# Patient Record
Sex: Female | Born: 1975 | Race: White | Hispanic: No | Marital: Married | Smoking: Current every day smoker
Health system: Southern US, Community
[De-identification: ages and names within clinical notes are randomized; demographics above are authoritative.]

## PROBLEM LIST (undated history)

## (undated) DIAGNOSIS — G43909 Migraine, unspecified, not intractable, without status migrainosus: Secondary | ICD-10-CM

## (undated) DIAGNOSIS — G2581 Restless legs syndrome: Secondary | ICD-10-CM

## (undated) HISTORY — DX: Restless legs syndrome: G25.81

## (undated) HISTORY — DX: Migraine, unspecified, not intractable, without status migrainosus: G43.909

---

## 2011-02-08 ENCOUNTER — Other Ambulatory Visit: Payer: Self-pay

## 2011-02-13 ENCOUNTER — Other Ambulatory Visit (HOSPITAL_COMMUNITY): Payer: Self-pay | Admitting: *Deleted

## 2011-02-13 DIAGNOSIS — O280 Abnormal hematological finding on antenatal screening of mother: Secondary | ICD-10-CM

## 2011-02-19 ENCOUNTER — Ambulatory Visit (HOSPITAL_COMMUNITY)
Admission: RE | Admit: 2011-02-19 | Discharge: 2011-02-19 | Disposition: A | Discharge status: Home / Self Care | Payer: BC Managed Care – PPO | Source: Ambulatory Visit | Attending: Obstetrics and Gynecology | Admitting: Obstetrics and Gynecology

## 2011-02-19 ENCOUNTER — Other Ambulatory Visit (HOSPITAL_COMMUNITY): Payer: Self-pay | Admitting: *Deleted

## 2011-02-19 ENCOUNTER — Encounter (HOSPITAL_COMMUNITY): Payer: Self-pay

## 2011-02-19 DIAGNOSIS — O99330 Smoking (tobacco) complicating pregnancy, unspecified trimester: Secondary | ICD-10-CM | POA: Insufficient documentation

## 2011-02-19 DIAGNOSIS — O280 Abnormal hematological finding on antenatal screening of mother: Secondary | ICD-10-CM

## 2011-02-19 DIAGNOSIS — O3440 Maternal care for other abnormalities of cervix, unspecified trimester: Secondary | ICD-10-CM | POA: Insufficient documentation

## 2011-02-19 DIAGNOSIS — Z8751 Personal history of pre-term labor: Secondary | ICD-10-CM | POA: Insufficient documentation

## 2011-02-19 DIAGNOSIS — Z363 Encounter for antenatal screening for malformations: Secondary | ICD-10-CM | POA: Insufficient documentation

## 2011-02-19 DIAGNOSIS — Z1389 Encounter for screening for other disorder: Secondary | ICD-10-CM | POA: Insufficient documentation

## 2011-02-19 DIAGNOSIS — O358XX0 Maternal care for other (suspected) fetal abnormality and damage, not applicable or unspecified: Secondary | ICD-10-CM | POA: Insufficient documentation

## 2011-02-19 DIAGNOSIS — O09529 Supervision of elderly multigravida, unspecified trimester: Secondary | ICD-10-CM | POA: Insufficient documentation

## 2011-02-19 NOTE — Progress Notes (Signed)
OB ultrasound completed today.  Please see report in [facility].

## 2011-02-19 NOTE — Progress Notes (Signed)
Genetic Counseling  High-Risk Gestation Note  Appointment Date:  02/19/2011 Referred By: Tasha Ponto, DO Date of Birth:  [birth year] Partner:  Tasha Campbell Attending: Rica Koyanagi, MD   [name] and her partner, Mr. Tasha Campbell, were seen for genetic counseling regarding a maternal age of 35 y.o. and an increased risk for Down syndrome based on Quad screening performed through [facility]. The couple was accompanied by [name] today.   They were counseled regarding maternal age and the association with risk for chromosome conditions due to nondisjunction with aging of the ova.   We reviewed chromosomes, nondisjunction, and the associated 1 in 111 risk for fetal aneuploidy related to a maternal age of 21 at [redacted] weeks gestation.  They were counseled that the risk for aneuploidy decreases as gestational age increases, accounting for those pregnancies which spontaneously abort.  We specifically discussed Down syndrome (trisomy 2), trisomies 66 and 40, and sex chromosome aneuploidies (47,XXX and 47,XXY) including the common features and prognoses of each.   We also reviewed Tasha Campbell's maternal serum Quad screen result and the associated increase in risk for fetal Down syndrome (1 in 207 to 1 in 53).  They were counseled regarding other explanations for a screen positive result including normal variation and differences in maternal metabolism.  In addition, we reviewed the screen negative risks for trisomy 18 and ONTDs.  They understand that Quad screening provides a pregnancy specific risk for Down syndrome, but is not considered to be diagnostic.  Additionally, we discussed that the level of one of the proteins analyzed (DIA) is very high (3.09 MoM). This has been associated with an increased risk for adverse pregnancy outcomes. Thus, a third trimester ultrasound may be offered to assess for fetal growth.   They were counseled regarding other available screening and diagnostic  options including ultrasound and amniocentesis.  The risks, benefits, and limitations of each of these options were reviewed in detail.  After thoughtful consideration of these options, they elected to proceed with ultrasound, but declined amniocentesis.  A complete detailed ultrasound was performed today.  The ultrasound report will be sent under separate cover.  They understand that screening tests cannot rule out all birth defects or genetic syndromes.  The patient was advised of this limitation and states she still does not want diagnostic testing at this time.  However, they were counseled that 50-80% of fetuses with Down syndrome and up to 90% of fetuses with trisomies 13 and 18, when well visualized, have detectable anomalies or soft markers by ultrasound.   Tasha Campbell was provided with written information regarding cystic fibrosis (CF) including the carrier frequency and incidence in the Caucasian population, the availability of carrier testing and prenatal diagnosis if indicated.  In addition, we discussed that CF is routinely screened for as part of the [place] newborn screening panel.  She declined testing today.   Both family histories were reviewed and found to be contributory for the father of the pregnancy's maternal first cousin once removed with cleft lip. We discussed that cleft lip +/- cleft palate can be syndromic or isolated.  If the patient's relative has a syndromic form of clefting, the chance of having an affected child depends on the inheritance pattern of that condition.  If the patient's relative has an isolated form of clefting, we discussed the probable multifactorial inheritance and explained that genetic testing for isolated cleft lip +/- cleft palate is not currently available.  Based on the family history, this couple's  chance to have a baby with an isolated cleft lip +/- cleft palate is not increased above the general population risk, in the case of isolated occurrence and assuming  multifactorial inheritance.  The father of the pregnancy also reported a female maternal first cousin with cerebral palsy and mild mental retardation due to oxygen deprivation after birth. This family history is not thought to increase the chance for cerebral palsy in the current pregnancy, assuming that the diagnosis is correct and especially when a cause is highly suspected. Additionally, Mr. Tasha Campbell reported a female maternal first cousin once removed with autism.  We discussed that autism spectrum disorders are typically multifactorial in etiology, caused by a combination of both genes and environmental factors. However, autism can occur as a feature of an underlying genetic condition such as fragile X syndrome.  If the relative has idiopathic autism, the risk of recurrence is estimated to be close to that of the general population.  If this relative's autism is syndromic, the risk of recurrence depends upon the inheritance of the condition. Without further information regarding the provided family history, an accurate genetic risk cannot be calculated.   Further genetic counseling is warranted if more information is obtained.  Tasha Campbell reported a female maternal first cousin with hydrocephalus that required shunt placement. She is currently 36 years old and reportedly healthy. Her two children are also reportedly in good health. Hydrocephalus may be an isolated finding that occurs sporadically, it may be an isolated condition that is inherited or passed through families, or it may be one feature of an underlying genetic syndrome or chromosome abnormality.  If it were a sporadic isolated event, the risk to the current pregnancy is not expected to be increased over the general population risk given the degree of relationship.  Additional information is needed in order to assess the risk to the current pregnancy. Without further information regarding the provided family history, an accurate genetic risk cannot  be calculated. Further genetic counseling is warranted if more information is obtained.  Ms. Ettamae Barkett denied exposure to environmental toxins or chemical agents. She denied the use of alcohol or street drugs. She reported smoking approximately a pack of cigarettes per day. The associations of smoking in pregnancy were reviewed and cessation encouraged. She denied significant viral illnesses during the course of her pregnancy. Her medical and surgical histories were noncontributory.     I counseled this couple regarding the above risks and available options.  The approximate face-to-face time with the genetic counselor was 40 minutes.    Clydie Braun Eiman Maret, MS, [facility] 02/19/2011

## 2011-04-04 ENCOUNTER — Ambulatory Visit (HOSPITAL_COMMUNITY)
Admission: RE | Admit: 2011-04-04 | Payer: BC Managed Care – PPO | Source: Ambulatory Visit | Attending: Maternal and Fetal Medicine | Admitting: Maternal and Fetal Medicine

## 2017-09-12 DIAGNOSIS — F17218 Nicotine dependence, cigarettes, with other nicotine-induced disorders: Secondary | ICD-10-CM | POA: Diagnosis not present

## 2017-09-12 DIAGNOSIS — D72829 Elevated white blood cell count, unspecified: Secondary | ICD-10-CM | POA: Diagnosis not present

## 2017-10-21 ENCOUNTER — Other Ambulatory Visit: Payer: Self-pay | Admitting: Internal Medicine

## 2017-10-21 DIAGNOSIS — N6452 Nipple discharge: Secondary | ICD-10-CM

## 2017-10-24 ENCOUNTER — Ambulatory Visit
Admission: RE | Admit: 2017-10-24 | Discharge: 2017-10-24 | Disposition: A | Discharge status: Home / Self Care | Payer: BLUE CROSS/BLUE SHIELD | Source: Ambulatory Visit | Attending: Internal Medicine | Admitting: Internal Medicine

## 2017-10-24 DIAGNOSIS — N6452 Nipple discharge: Secondary | ICD-10-CM

## 2017-12-01 ENCOUNTER — Encounter: Payer: Self-pay | Admitting: Gastroenterology

## 2017-12-19 ENCOUNTER — Encounter: Payer: Self-pay | Admitting: Hematology and Oncology

## 2017-12-19 ENCOUNTER — Telehealth: Payer: Self-pay | Admitting: Hematology and Oncology

## 2017-12-19 NOTE — Telephone Encounter (Signed)
New referral from Dr. Derrell LollingIngram at [facility] for the high risk breast clinic. Pt has been scheduled to see Dr. Pamelia HoitGudena on 10/2 at 1pm. Pt aware to arrive 30 minutes early. Cld Kim at [facility] to obtain the pt's path rpt. Letter mailed to the pt.

## 2018-01-07 ENCOUNTER — Other Ambulatory Visit: Payer: Self-pay

## 2018-01-07 ENCOUNTER — Telehealth: Payer: Self-pay | Admitting: Hematology and Oncology

## 2018-01-07 ENCOUNTER — Inpatient Hospital Stay: Payer: BLUE CROSS/BLUE SHIELD | Attending: Hematology and Oncology | Admitting: Hematology and Oncology

## 2018-01-07 DIAGNOSIS — Z9189 Other specified personal risk factors, not elsewhere classified: Secondary | ICD-10-CM | POA: Insufficient documentation

## 2018-01-07 DIAGNOSIS — Z803 Family history of malignant neoplasm of breast: Secondary | ICD-10-CM | POA: Diagnosis not present

## 2018-01-07 DIAGNOSIS — Z1231 Encounter for screening mammogram for malignant neoplasm of breast: Secondary | ICD-10-CM

## 2018-01-07 NOTE — Telephone Encounter (Signed)
Gave patient avs and calendar.   °

## 2018-01-07 NOTE — Progress Notes (Signed)
Genetics referral placed per Dr. Pamelia Hoit.

## 2018-01-07 NOTE — Assessment & Plan Note (Signed)
Patient has mother age 43, grandmother in her 27s, great grandmother in her 61s with breast cancers Recent lumpectomy revealed benign breast tissue. Patient was referred for high risk breast clinic given her extensive family history.  Debbora Dus calculation of risk: 10-year risk 4% (average woman's risk 2%) Lifetime risk of 25% (average woman's risk 12%)  Recommendations for risk reduction 1.  Exercise 2. maintaining proper weight 3.  Decrease alcohol consumption 4.  Decreased red meat consumption 5.  Increased fruits and vegetables 6.  There is some role of vitamin D and turmeric and decreasing cancer risk.  I do not recommend risk reduction tamoxifen or raloxifene.  Breast cancer surveillance: 1.  Annual breast exams 2. annual mammograms 3.  Annual breast MRIs: I ordered an MRI to be done in December  Return to clinic in 1 year for surveillance

## 2018-01-07 NOTE — Telephone Encounter (Signed)
Per 10/2 los.  Radiology will call patient with MRI appt.

## 2018-01-07 NOTE — Progress Notes (Signed)
[facility] CONSULT NOTE  Patient Care Team: Shelbie Ammons, MD as PCP - General (Internal Medicine)  CHIEF COMPLAINTS/PURPOSE OF CONSULTATION:  High risk of breast cancer  HISTORY OF PRESENTING ILLNESS:  Tasha Campbell 42 y.o. female is here because of high risk of breast cancer.  Recently patient had nipple discharge and underwent a duct excision along with the lumpectomy which revealed benign breast tissue.  She has extensive family history of breast cancer and was referred to [place] for discussion regarding high risk of breast cancer.  She is accompanied today by her husband.  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 She is getting worked up for hysterectomy for endometriosis. Previously she had taken progesterone for about a year to decrease her cycles. I reviewed her records extensively and collaborated the history with the patient.  MEDICAL HISTORY:  Endometriosis SURGICAL HISTORY: Lumpectomy breast SOCIAL HISTORY: Denies any tobacco or alcohol or recreational drug use FAMILY HISTORY: Family History  Problem Relation Age of Onset  . Hydrocephalus Cousin        paternal first cousin  . Breast cancer Mother   . Breast cancer Maternal Grandmother     ALLERGIES:  has no allergies on file.  MEDICATIONS: Does not take any medications REVIEW OF SYSTEMS:   Constitutional: Denies fevers, chills or abnormal night sweats Eyes: Denies blurriness of vision, double vision or watery eyes Ears, nose, mouth, throat, and face: Denies mucositis or sore throat Respiratory: Denies cough, dyspnea or wheezes Cardiovascular: Denies palpitation, chest discomfort or lower extremity swelling Gastrointestinal:  Denies nausea, heartburn or change in bowel habits Skin: Denies abnormal skin rashes Lymphatics: Denies new lymphadenopathy or easy bruising Neurological:Denies numbness, tingling or new weaknesses Behavioral/Psych: Mood is stable, no new changes  Breast:  Denies any palpable  lumps or discharge All other systems were reviewed with the patient and are negative.  PHYSICAL EXAMINATION: ECOG PERFORMANCE STATUS: 0 - Asymptomatic  Vitals:   01/07/18 1245  BP: 116/80  Pulse: (!) 101  Resp: 18  Temp: 98.2 F (36.8 C)  SpO2: 99%   Filed Weights   01/07/18 1245  Weight: 178 lb (80.7 kg)    GENERAL:alert, no distress and comfortable SKIN: skin color, texture, turgor are normal, no rashes or significant lesions EYES: normal, conjunctiva are pink and non-injected, sclera clear OROPHARYNX:no exudate, no erythema and lips, buccal mucosa, and tongue normal  NECK: supple, thyroid normal size, non-tender, without nodularity LYMPH:  no palpable lymphadenopathy in the cervical, axillary or inguinal LUNGS: clear to auscultation and percussion with normal breathing effort HEART: regular rate & rhythm and no murmurs and no lower extremity edema ABDOMEN:abdomen soft, non-tender and normal bowel sounds Musculoskeletal:no cyanosis of digits and no clubbing  PSYCH: alert & oriented x 3 with fluent speech NEURO: no focal motor/sensory deficits BREAST: No palpable nodules in breast. No palpable axillary or supraclavicular lymphadenopathy (exam performed in the presence of a chaperone)   ASSESSMENT AND PLAN:  At high risk for breast cancer Patient has mother age 27, grandmother in her 44s, great grandmother in her 71s with breast cancers Recent lumpectomy revealed benign breast tissue. Patient was referred for high risk breast clinic given her extensive family history.  Debbora Dus calculation of risk: 10-year risk 4% (average woman's risk 2%) Lifetime risk of 25% (average woman's risk 12%)  Recommendations for risk reduction 1.  Exercise 2. maintaining proper weight 3.  Decrease alcohol consumption 4.  Decreased red meat consumption 5.  Increased fruits  and vegetables 6.  There is some role of vitamin D and turmeric and decreasing cancer risk.  I do not recommend  risk reduction tamoxifen or raloxifene.  Breast cancer surveillance: 1.  Annual breast exams 2. annual mammograms 3.  Annual breast MRIs: I ordered an MRI to be done in December  Return to clinic in 1 year for surveillance   All questions were answered. The patient knows to call the clinic with any problems, questions or concerns.    Tamsen Meek, MD 01/07/18

## 2018-01-09 ENCOUNTER — Telehealth: Payer: Self-pay | Admitting: Hematology and Oncology

## 2018-01-09 NOTE — Telephone Encounter (Signed)
Scheduled appt per 10/4 sch message for genetics- left message for pt with appt date and time as well as call back number.

## 2018-01-29 ENCOUNTER — Inpatient Hospital Stay: Payer: BLUE CROSS/BLUE SHIELD

## 2018-03-07 ENCOUNTER — Ambulatory Visit
Admission: RE | Admit: 2018-03-07 | Discharge: 2018-03-07 | Disposition: A | Discharge status: Home / Self Care | Payer: BLUE CROSS/BLUE SHIELD | Source: Ambulatory Visit | Attending: Hematology and Oncology | Admitting: Hematology and Oncology

## 2018-03-07 DIAGNOSIS — Z1231 Encounter for screening mammogram for malignant neoplasm of breast: Secondary | ICD-10-CM

## 2018-03-07 MED ORDER — GADOBUTROL 1 MMOL/ML IV SOLN
9.0000 mL | Freq: Once | INTRAVENOUS | Status: AC | PRN
  Administered 2018-03-07: 9 mL via INTRAVENOUS

## 2018-03-09 ENCOUNTER — Telehealth: Payer: Self-pay | Admitting: Hematology and Oncology

## 2018-03-09 NOTE — Telephone Encounter (Signed)
I informed the patient that the breast MRI was normal. 

## 2019-01-08 ENCOUNTER — Inpatient Hospital Stay: Payer: 59 | Attending: Hematology and Oncology | Admitting: Hematology and Oncology

## 2019-01-08 NOTE — Assessment & Plan Note (Deleted)
Patient has mother age 44, grandmother in her 5s, great grandmother in her 82s with breast cancers Recent lumpectomy revealed benign breast tissue. Patient was referred for high risk breast clinic given her extensive family history.  Johny Drilling calculation of risk: 10-year risk 4% (average woman's risk 2%) Lifetime risk of 25% (average woman's risk 12%) ------------------------------------------------------------------------- Breast cancer surveillance: 1.  Annual breast exams: 01/08/2019: Benign 2. annual mammograms: 10/24/2017: Benign breast density category C, because of nipple discharge was referred to surgery 3.  Annual breast MRIs: 03/07/2018: Benign, breast density category C  Return to clinic in 1 year for follow-up

## 2020-07-19 ENCOUNTER — Telehealth: Payer: Self-pay | Admitting: Hematology and Oncology

## 2020-07-19 NOTE — Telephone Encounter (Signed)
Scheduled appt per 4/13 sch msg. Pt aware.  

## 2020-08-23 NOTE — Assessment & Plan Note (Signed)
At high risk for breast cancer Patient has mother age 46, grandmother in her 77s, great grandmother in her 12s with breast cancers Recent lumpectomy revealed benign breast tissue. Patient was referred for high risk breast clinic given her extensive family history.  Debbora Dus calculation of risk: 10-year risk 4% (average woman's risk 2%) Lifetime risk of 25% (average woman's risk 12%)  Breast Cancer Surveillance: 1. Breast Exam: 08/23/20:  2. Mammogram: 10/24/17: Benign 3. MRI Breast 03/09/2018: Benign, density cat C

## 2020-08-24 ENCOUNTER — Telehealth: Payer: Self-pay | Admitting: Hematology and Oncology

## 2020-08-24 ENCOUNTER — Other Ambulatory Visit: Payer: Self-pay

## 2020-08-24 ENCOUNTER — Inpatient Hospital Stay: Payer: 59 | Attending: Hematology and Oncology | Admitting: Hematology and Oncology

## 2020-08-24 DIAGNOSIS — N644 Mastodynia: Secondary | ICD-10-CM | POA: Diagnosis not present

## 2020-08-24 DIAGNOSIS — Z803 Family history of malignant neoplasm of breast: Secondary | ICD-10-CM | POA: Diagnosis present

## 2020-08-24 DIAGNOSIS — N925 Other specified irregular menstruation: Secondary | ICD-10-CM | POA: Insufficient documentation

## 2020-08-24 DIAGNOSIS — Z9189 Other specified personal risk factors, not elsewhere classified: Secondary | ICD-10-CM

## 2020-08-24 MED ORDER — PRAMIPEXOLE DIHYDROCHLORIDE 0.25 MG PO TABS: 0.2500 mg | ORAL_TABLET | Freq: Three times a day (TID) | ORAL | Status: AC

## 2020-08-24 MED ORDER — BUPROPION HCL ER (XL) 150 MG PO TB24: 150.0000 mg | ORAL_TABLET | Freq: Every day | ORAL | Status: AC

## 2020-08-24 NOTE — Progress Notes (Signed)
Patient Care Team: Galvin Proffer, MD as PCP - General (Internal Medicine)  DIAGNOSIS:    ICD-10-CM   1. At high risk for breast cancer  Z91.89 MR BREAST BILATERAL W WO CONTRAST INC CAD    CHIEF COMPLIANT: Follow-up of high risk for breast cancer  INTERVAL HISTORY: Tasha Campbell is a 45 y.o. with above-mentioned history of high risk for breast cancer. I last saw her 2.5 years ago. She presents to the clinic today for follow-up.  She reports slight tenderness in the right breast on the lateral side.  This comes and goes intermittently.  Mammograms done at [facility] were fine.  She reports no new concerns.  Her menstrual cycles have become very irregular.  She is unfortunately gained a lot of weight.  ALLERGIES:  has no allergies on file.  MEDICATIONS:  Current Outpatient Medications  Medication Sig Dispense Refill  . buPROPion (WELLBUTRIN XL) 150 MG 24 hr tablet Take 1 tablet (150 mg total) by mouth daily.    . pramipexole (MIRAPEX) 0.25 MG tablet Take 1 tablet (0.25 mg total) by mouth 3 (three) times daily.     No current facility-administered medications for this visit.    PHYSICAL EXAMINATION: ECOG PERFORMANCE STATUS: 1 - Symptomatic but completely ambulatory  Vitals:   08/24/20 1048  BP: 118/83  Pulse: 91  Resp: 16  Temp: 97.6 F (36.4 C)  SpO2: 100%   Filed Weights   08/24/20 1048  Weight: 187 lb 8 oz (85 kg)    BREAST: No palpable masses or nodules in either right or left breasts. No palpable axillary supraclavicular or infraclavicular adenopathy no breast tenderness or nipple discharge. (exam performed in the presence of a chaperone)  LABORATORY DATA:  I have reviewed the data as listed No flowsheet data found.  No results found for: WBC, HGB, HCT, MCV, PLT, NEUTROABS  ASSESSMENT & PLAN:  At high risk for breast cancer At high risk for breast cancer Patient has mother age 66, grandmother in her 69s, great grandmother in her 42s with breast  cancers Recent lumpectomy revealed benign breast tissue. Patient was referred for high risk breast clinic given her extensive family history.  Tasha Campbell calculation of risk: 10-year risk 4% (average woman's risk 2%) Lifetime risk of 25% (average woman's risk 12%)  Breast Cancer Surveillance: 1. Breast Exam: 08/23/20: Benign, slight tenderness in the right breast but no palpable lumps or nodules. 2. Mammogram: 2022 at [facility]: Apparently benign we will try to get the reports. 3. MRI Breast 03/09/2018: Benign, density cat C  Weight issues: I discussed with her the importance of losing a few pounds so that she can decrease her risk of cancer.  This will also help her joints and her general health.  We will plan to do breast MRIs every 2 to 3 years.  We will set up the next breast MRI to be done in December 2022.  Return to clinic in 1 year for follow-up  Orders Placed This Encounter  Procedures  . MR BREAST BILATERAL W WO CONTRAST INC CAD    Standing Status:   Future    Standing Expiration Date:   08/24/2021    Order Specific Question:   If indicated for the ordered procedure, I authorize the administration of contrast media per Radiology protocol    Answer:   Yes    Order Specific Question:   What is the patient's sedation requirement?    Answer:   No Sedation  Order Specific Question:   Does the patient have a pacemaker or implanted devices?    Answer:   No    Order Specific Question:   Preferred imaging location?    Answer:   [facility] (table limit-550lbs)    Order Specific Question:   Release to patient    Answer:   Immediate   The patient has a good understanding of the overall plan. she agrees with it. she will call with any problems that may develop before the next visit here.  Total time spent: 20 mins including face to face time and time spent for planning, charting and coordination of care  Sabas Sous, MD, MPH 08/24/2020  I, Kirt Boys Dorshimer, am  acting as scribe for Dr. Serena Croissant.  I have reviewed the above documentation for accuracy and completeness, and I agree with the above.

## 2020-08-24 NOTE — Telephone Encounter (Signed)
Scheduled appointment per 05/19 los. Patient is aware. 

## 2020-08-25 ENCOUNTER — Other Ambulatory Visit: Payer: Self-pay | Admitting: Hematology and Oncology

## 2020-09-07 ENCOUNTER — Other Ambulatory Visit: Payer: Self-pay

## 2020-09-07 ENCOUNTER — Ambulatory Visit
Admission: RE | Admit: 2020-09-07 | Discharge: 2020-09-07 | Disposition: A | Discharge status: Home / Self Care | Payer: 59 | Source: Ambulatory Visit | Attending: Hematology and Oncology | Admitting: Hematology and Oncology

## 2020-09-07 DIAGNOSIS — Z9189 Other specified personal risk factors, not elsewhere classified: Secondary | ICD-10-CM

## 2020-09-07 MED ORDER — GADOBUTROL 1 MMOL/ML IV SOLN
9.0000 mL | Freq: Once | INTRAVENOUS | Status: AC | PRN
  Administered 2020-09-07: 9 mL via INTRAVENOUS

## 2021-07-20 ENCOUNTER — Telehealth: Payer: Self-pay | Admitting: Hematology and Oncology

## 2021-07-20 NOTE — Telephone Encounter (Signed)
Rescheduled appointment per provider PAL. Unable to leave message due to mailbox being full. Patient will be mailed an updated calendar. ?

## 2021-08-24 ENCOUNTER — Ambulatory Visit: Payer: 59 | Admitting: Hematology and Oncology

## 2021-08-27 ENCOUNTER — Inpatient Hospital Stay: Payer: 59 | Admitting: Hematology and Oncology

## 2021-08-27 NOTE — Assessment & Plan Note (Deleted)
Patient has mother age 47, grandmother in her 45s, great grandmother in her 91s with breast cancers Recent lumpectomy revealed benign breast tissue. Patient was referred for high risk breast clinic given her extensive family history.  Tasha Campbell calculation of risk: 10-year risk 4%(average woman's risk 2%) Lifetime risk of 25%(average woman's risk 12%)  Breast Cancer Surveillance: 1. Breast Exam: 08/27/2021: Benign, slight tenderness in the right breast but no palpable lumps or nodules. 2. Mammogram: 2023 at [facility]: Apparently benign we will try to get the reports. 3. MRI Breast 09/07/2020: Benign, density cat C  Weight issues: I discussed with her the importance of losing a few pounds so that she can decrease her risk of cancer.  This will also help her joints and her general health.  We will plan to do breast MRIs every 2 to 3 years.  We will set up the next breast MRI to be done in December 2025.  Return to clinic in 1 year for follow-up

## 2021-10-04 ENCOUNTER — Inpatient Hospital Stay: Payer: 59 | Attending: Hematology and Oncology | Admitting: Hematology and Oncology

## 2021-10-04 NOTE — Assessment & Plan Note (Deleted)
Patient has mother age 47, grandmother in her 73s, great grandmother in her 57s with breast cancers Recent lumpectomy revealed benign breast tissue. Patient was referred for high risk breast clinic given her extensive family history.  Debbora Dus calculation of risk: 10-year risk 4%(average woman's risk 2%) Lifetime risk of 25%(average woman's risk 12%)  Breast Cancer Surveillance: 1. Breast Exam: 10/04/21: Benign,  2. Mammogram: 2023 at [facility]: Apparently benign we will try to get the reports. 3. MRI Breast 09/07/2020: Benign, density cat C  RTC in 1 year

## 2021-12-31 IMAGING — MR MR BREAST BILAT WO/W CM
8 of 12 series · 32 of 48 positions shown · IV contrast (9 ml gadavist)
Comparison: Previous exams, including breast MRI dated 03/07/2018.

CLINICAL DATA: 44-year-old female with a strong family history of
breast cancer, including in mother diagnosed with breast cancer age
60, grandmother in her late 50s as well as great grandmother having
been diagnosed with breast cancer. The patient has a history of left
duct excision December 2017 secondary to spontaneous clear nipple
discharge.

LABS:  Not applicable.
EXAM:
BILATERAL BREAST MRI WITH AND WITHOUT CONTRAST
TECHNIQUE: Multiplanar, multisequence MR images of both breasts were obtained
prior to and following the intravenous administration of 9 ml of
Gadavist

[Series 2: t2_tirm_tra ipat (a-p) · axial · 3.0mm · 0.70mm/px · 1 of 55 slices shown]
[im 1/55]
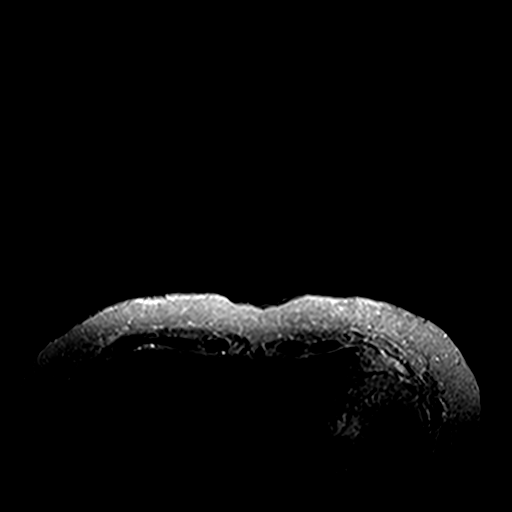

[Series 3: fl3d pre-cm no · axial · non-contrast · 1.2mm · 0.94mm/px · z∈[-102,+70]mm · 5 of 144 slices shown]
[im 1/144]
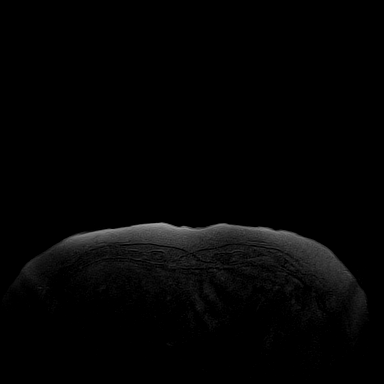
[im 36/144]
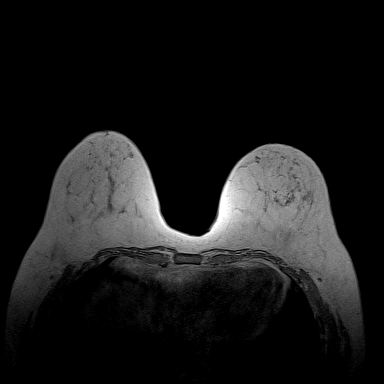
[im 72/144]
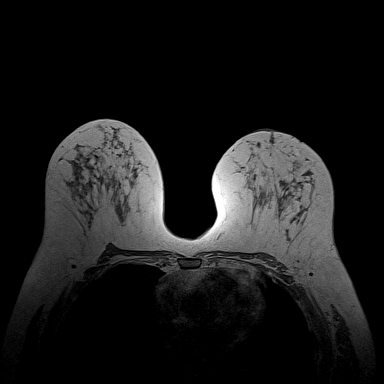
[im 108/144]
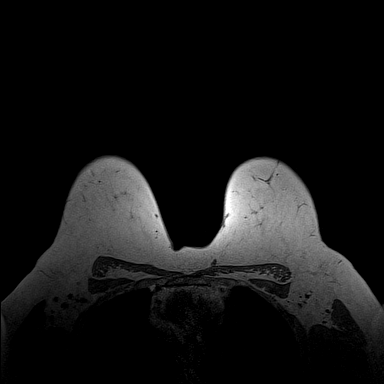
[im 144/144]
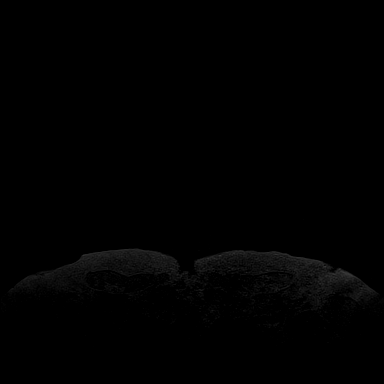

[Series 4: fl3d pre-cm · axial · non-contrast · 1.2mm · 0.94mm/px · z∈[-102,+70]mm · 5 of 144 slices shown]
[im 1/144]
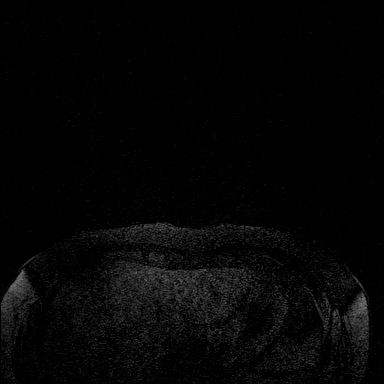
[im 36/144]
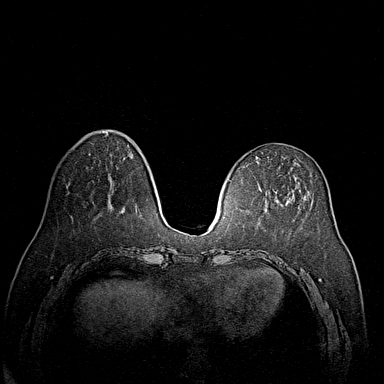
[im 72/144]
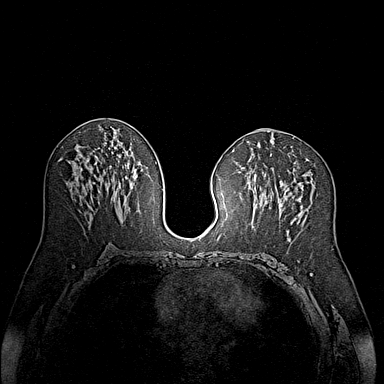
[im 108/144]
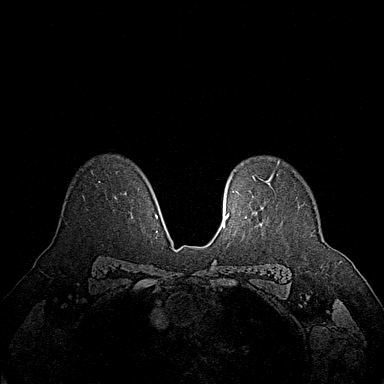
[im 144/144]
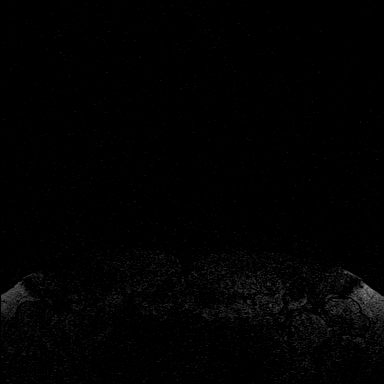

[Series 5: fl3d post-cm 20 · axial · 1.2mm · 0.94mm/px · z∈[-102,+70]mm · 5 of 144 slices shown (1 of 3)]
[im 1/144]
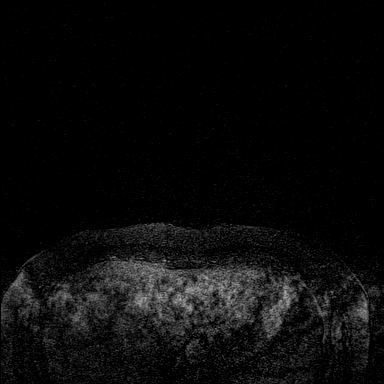
[im 36/144]
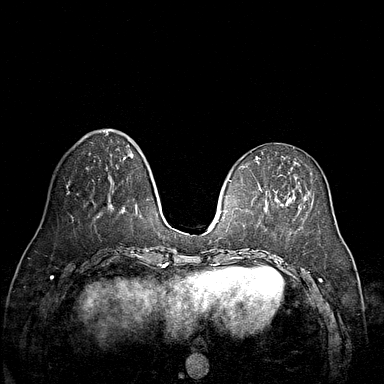
[im 72/144]
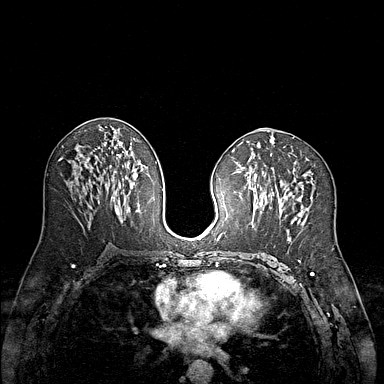
[im 108/144]
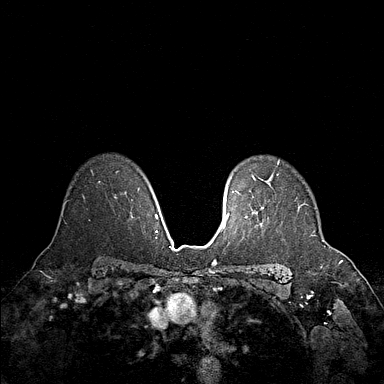
[im 144/144]
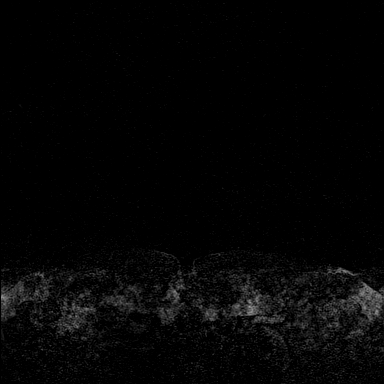

[Series 6: fl3d post-cm 20 · axial · 1.2mm · 0.94mm/px · z∈[-102,+70]mm · 5 of 144 slices shown (2 of 3)]
[im 1/144]
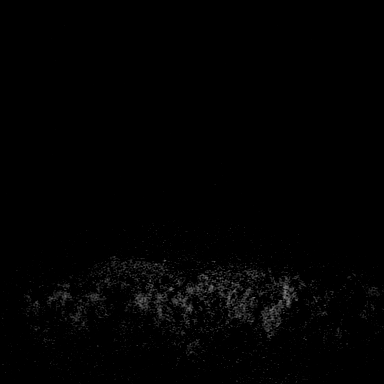
[im 36/144]
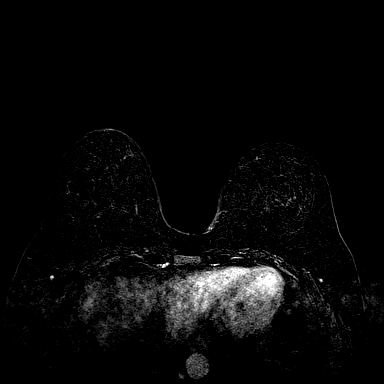
[im 72/144]
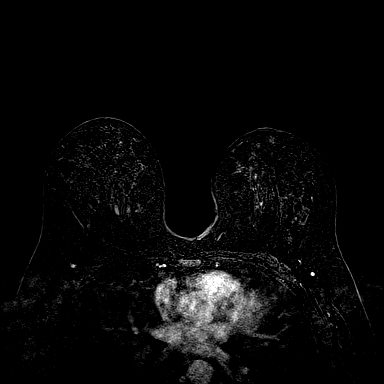
[im 108/144]
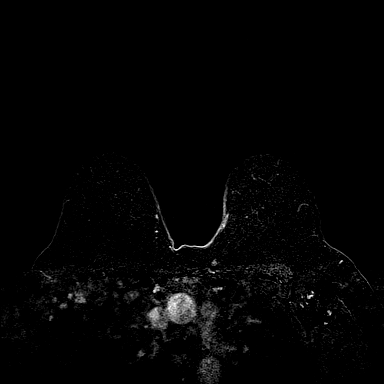
[im 144/144]
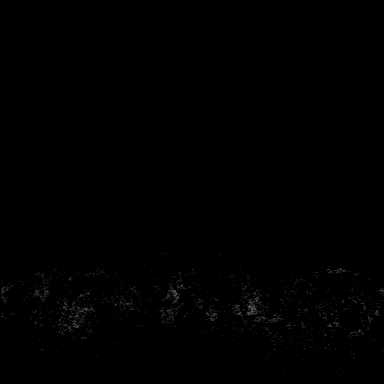

[Series 7: fl3d post-cm 20 · axial · 172.8mm · 0.94mm/px · 1 of 1 slices shown (3 of 3)]
[im 1/1]
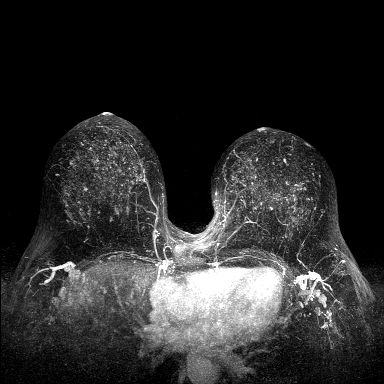

[Series 8: fl3d post-cm 3min · axial · 1.2mm · 0.94mm/px · z∈[-102,+70]mm · 6 of 144 slices shown]
[im 1/144]
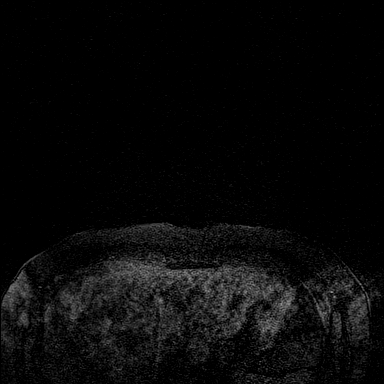
[im 29/144]
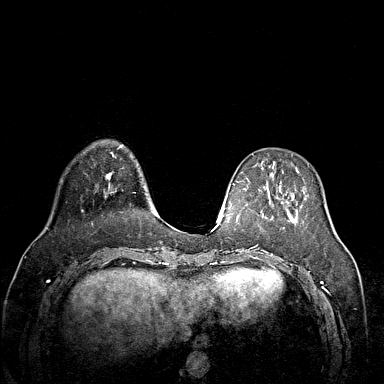
[im 58/144]
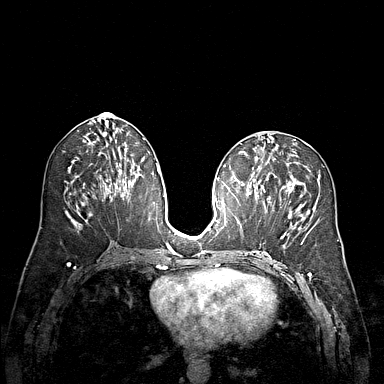
[im 86/144]
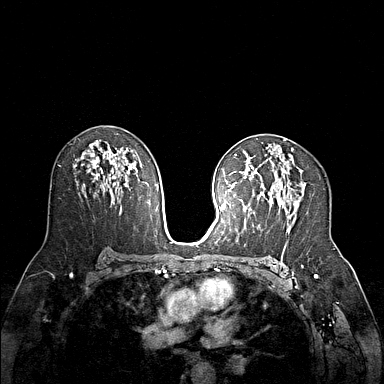
[im 115/144]
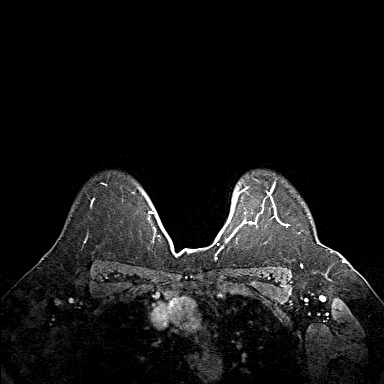
[im 144/144]
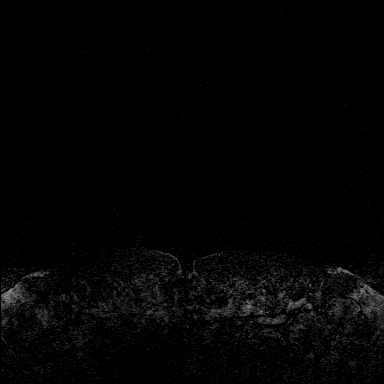

[Series 9: fl3d post-cm 3min_sub · axial · 1.2mm · 0.94mm/px · z∈[-102,+0]mm · 4 of 144 slices shown]
[im 1/144]
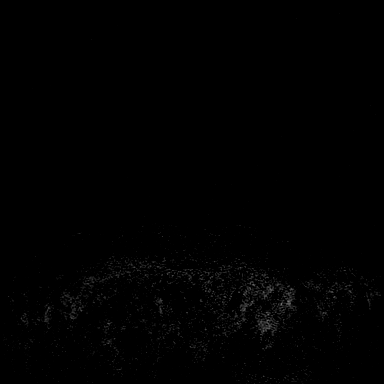
[im 29/144]
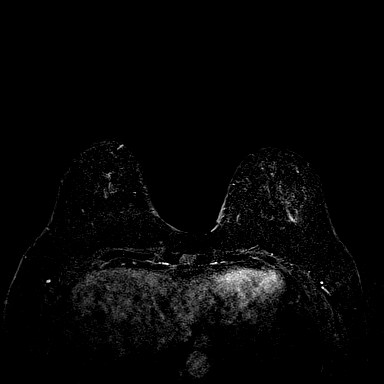
[im 58/144]
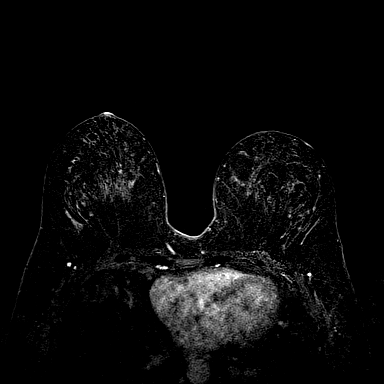
[im 86/144]
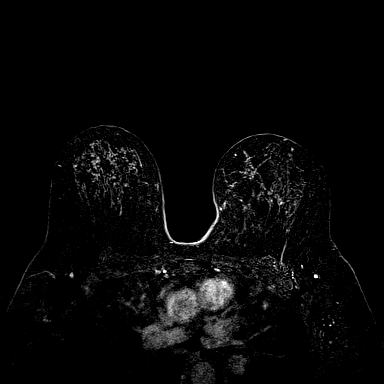

[32 of 48 positions shown; findings below may reference images not displayed]

Three-dimensional MR images were rendered by post-processing of the
original MR data on an independent workstation. The
three-dimensional MR images were interpreted, and findings are
reported in the following complete MRI report for this study. Three
dimensional images were evaluated at the independent interpreting
workstation using the DynaCAD thin client.
FINDINGS: Breast composition: c.  Heterogeneous fibroglandular tissue.

Background parenchymal enhancement: Moderate to marked background
parenchymal enhancement.

Right breast: No suspicious enhancing masses or abnormal areas of
enhancement.

Left breast: Postsurgical changes again identified in the
retroareolar left breast. No suspicious enhancing masses or abnormal
areas of enhancement identified in the left breast.

Lymph nodes: No abnormal appearing lymph nodes.

Ancillary findings:  None.
IMPRESSION: No MRI evidence of malignancy in either breast.

RECOMMENDATION:
Recommend annual routine screening mammography, due March 2021.

Based on the recommendations of the [employer], annual
screening MRI is suggested in addition to annual mammography if the
patient has an estimated lifetime risk of developing breast cancer
which is greater than 20%.

BI-RADS CATEGORY  2: Benign.

## 2023-08-28 ENCOUNTER — Encounter: Payer: Self-pay | Admitting: Hematology and Oncology

## 2023-08-28 ENCOUNTER — Inpatient Hospital Stay: Attending: Hematology and Oncology | Admitting: Hematology and Oncology

## 2023-08-28 ENCOUNTER — Other Ambulatory Visit: Payer: Self-pay | Admitting: Hematology and Oncology

## 2023-08-28 ENCOUNTER — Inpatient Hospital Stay

## 2023-08-28 VITALS — BP 134/83 | HR 91 | Temp 98.3°F | Resp 18 | Ht 64.57 in | Wt 191.0 lb

## 2023-08-28 DIAGNOSIS — G2581 Restless legs syndrome: Secondary | ICD-10-CM | POA: Insufficient documentation

## 2023-08-28 DIAGNOSIS — R61 Generalized hyperhidrosis: Secondary | ICD-10-CM | POA: Diagnosis not present

## 2023-08-28 DIAGNOSIS — F17210 Nicotine dependence, cigarettes, uncomplicated: Secondary | ICD-10-CM | POA: Diagnosis not present

## 2023-08-28 DIAGNOSIS — Z803 Family history of malignant neoplasm of breast: Secondary | ICD-10-CM | POA: Diagnosis not present

## 2023-08-28 DIAGNOSIS — M1990 Unspecified osteoarthritis, unspecified site: Secondary | ICD-10-CM | POA: Diagnosis not present

## 2023-08-28 DIAGNOSIS — D72829 Elevated white blood cell count, unspecified: Secondary | ICD-10-CM | POA: Diagnosis present

## 2023-08-28 DIAGNOSIS — R5383 Other fatigue: Secondary | ICD-10-CM | POA: Insufficient documentation

## 2023-08-28 LAB — CMP (CANCER CENTER ONLY)
ALT: 15 U/L (ref 0–44)
AST: 15 U/L (ref 15–41)
Albumin: 4.6 g/dL (ref 3.5–5.0)
Alkaline Phosphatase: 94 U/L (ref 38–126)
Anion gap: 13 (ref 5–15)
BUN: 14 mg/dL (ref 6–20)
CO2: 25 mmol/L (ref 22–32)
Calcium: 10.3 mg/dL (ref 8.9–10.3)
Chloride: 103 mmol/L (ref 98–111)
Creatinine: 0.79 mg/dL (ref 0.44–1.00)
GFR, Estimated: >60 mL/min (ref >60)
Glucose, Bld: 89 mg/dL (ref 70–99)
Potassium: 4 mmol/L (ref 3.5–5.1)
Sodium: 140 mmol/L (ref 135–145)
Total Bilirubin: 0.2 mg/dL (ref 0.0–1.2)
Total Protein: 7.5 g/dL (ref 6.5–8.1)

## 2023-08-28 LAB — CBC WITH DIFFERENTIAL (CANCER CENTER ONLY)
Abs Immature Granulocytes: 0.04 10*3/uL (ref 0.00–0.07)
Basophils Absolute: 0.1 10*3/uL (ref 0.0–0.1)
Basophils Relative: 1 %
Eosinophils Absolute: 0.3 10*3/uL (ref 0.0–0.5)
Eosinophils Relative: 2 %
HCT: 48.3 % — ABNORMAL HIGH (ref 36.0–46.0)
Hemoglobin: 15.9 g/dL — ABNORMAL HIGH (ref 12.0–15.0)
Immature Granulocytes: 0 %
Lymphocytes Relative: 33 %
Lymphs Abs: 3.8 10*3/uL (ref 0.7–4.0)
MCH: 28.4 pg (ref 26.0–34.0)
MCHC: 32.9 g/dL (ref 30.0–36.0)
MCV: 86.3 fL (ref 80.0–100.0)
Monocytes Absolute: 0.6 10*3/uL (ref 0.1–1.0)
Monocytes Relative: 5 %
Neutro Abs: 6.5 10*3/uL (ref 1.7–7.7)
Neutrophils Relative %: 59 %
Platelet Count: 355 10*3/uL (ref 150–400)
RBC: 5.6 MIL/uL — ABNORMAL HIGH (ref 3.87–5.11)
RDW: 12.7 % (ref 11.5–15.5)
WBC Count: 11.3 10*3/uL — ABNORMAL HIGH (ref 4.0–10.5)
nRBC: 0 % (ref 0.0–0.2)

## 2023-08-28 LAB — VITAMIN B12: Vitamin B-12: 330 pg/mL (ref 180–914)

## 2023-08-28 LAB — IRON AND TIBC
Iron: 113 ug/dL (ref 28–170)
Saturation Ratios: 29 % (ref 10.4–31.8)
TIBC: 391 ug/dL (ref 250–450)
UIBC: 278 ug/dL

## 2023-08-28 LAB — C-REACTIVE PROTEIN: CRP: 0.5 mg/dL (ref <1.0)

## 2023-08-28 LAB — FOLATE: Folate: 28.9 ng/mL (ref >5.9)

## 2023-08-28 LAB — TECHNOLOGIST SMEAR REVIEW: Plt Morphology: NORMAL

## 2023-08-28 LAB — SEDIMENTATION RATE: Sed Rate: 0 mm/h (ref 0–22)

## 2023-08-28 LAB — FERRITIN: Ferritin: 67 ng/mL (ref 11–307)

## 2023-08-28 NOTE — Progress Notes (Cosign Needed)
 [facility] [address] [place],  [place]  [address] [phone]  Clinic Day:  08/28/2023   Referring physician: Georgean Kindle, MD  Patient Care Team: Patient Care Team: Georgean Kindle, MD as PCP - General (Internal Medicine)   REASON FOR CONSULTATION:  Leukocytosis  HISTORY OF PRESENT ILLNESS:  Tasha Campbell is a 48 y.o. female with a history of leukocytosis who is referred in consultation by Guyann Leitz, MD for assessment and management. She has a history of elevated white count dating back three years rising as high as 16. Most recent white count this month was 11.9. She denies any workup in the past for leukocytosis. She denies chronic or acute infection. She was on [facility] for one month which can elevate the white count mildly; however, her white count has been elevated on occasions prior to being on Wegovy. She stopped taking the medication, but expresses desire to return to using it as she had good success with it and minimal side effects. Of note, she also has a slightly elevated hemoglobin which is most likely related to smoking. Medical history is significant for ocular migraines, depression and arthritis. Surgical history consists of cholecystectomy and tubal ligation. No family medical history. She is an everyday smoker of 1/2 pack per day down from 2 packs per day. She rarely drinks alcohol and denies use of drugs.   REVIEW OF SYSTEMS:  Review of Systems  Constitutional: Negative.   HENT:  Negative.    Eyes: Negative.   Respiratory: Negative.    Cardiovascular: Negative.   Gastrointestinal: Negative.   Endocrine: Positive for hot flashes.  Genitourinary: Negative.    Musculoskeletal:  Positive for arthralgias.  Skin: Negative.   Neurological:  Positive for headaches.  Hematological: Negative.   Psychiatric/Behavioral:  Positive for depression and sleep disturbance.      VITALS:   There were no vitals taken for this visit.  Wt Readings from  Last 3 Encounters:  08/24/20 187 lb 8 oz (85 kg)  01/07/18 178 lb (80.7 kg)    There is no height or weight on file to calculate BMI.  Performance status (ECOG): 1 - Symptomatic but completely ambulatory  PHYSICAL EXAM:  Physical Exam Constitutional:      Appearance: Normal appearance. She is normal weight.  HENT:     Head: Normocephalic and atraumatic.     Mouth/Throat:     Mouth: Mucous membranes are moist.  Cardiovascular:     Rate and Rhythm: Normal rate and regular rhythm.     Pulses: Normal pulses.     Heart sounds: Normal heart sounds.  Pulmonary:     Effort: Pulmonary effort is normal.     Breath sounds: Normal breath sounds.  Abdominal:     General: Abdomen is flat.     Palpations: Abdomen is soft.  Musculoskeletal:        General: Normal range of motion.     Cervical back: Normal range of motion.  Skin:    General: Skin is warm and dry.  Neurological:     General: No focal deficit present.     Mental Status: She is alert and oriented to person, place, and time. Mental status is at baseline.  Psychiatric:        Mood and Affect: Mood normal.        Behavior: Behavior normal.        Thought Content: Thought content normal.        Judgment: Judgment  normal.      LABS:      Latest Ref Rng & Units 08/28/2023   10:32 AM  CBC  WBC 4.0 - 10.5 K/uL 11.3   Hemoglobin 12.0 - 15.0 g/dL 32.2   Hematocrit 02.5 - 46.0 % 48.3   Platelets 150 - 400 K/uL 355        No data to display           No results found for: "CEA1", "CEA" / No results found for: "CEA1", "CEA" No results found for: "PSA1" No results found for: "KYH062" No results found for: "CAN125"  No results found for: "TOTALPROTELP", "ALBUMINELP", "A1GS", "A2GS", "BETS", "BETA2SER", "GAMS", "MSPIKE", "SPEI" No results found for: "TIBC", "FERRITIN", "IRONPCTSAT" No results found for: "LDH"  STUDIES:  No results found.    HISTORY:  No past medical history on file.  Past Surgical History:   Procedure Laterality Date   CHOLECYSTECTOMY      Family History  Problem Relation Age of Onset   Hydrocephalus Cousin        paternal first cousin   Breast cancer Mother    Breast cancer Maternal Grandmother     Social History:  has no history on file for tobacco use, alcohol use, and drug use.The patient is alone today.  Allergies: Not on File  Current Medications: Current Outpatient Medications  Medication Sig Dispense Refill   buPROPion  (WELLBUTRIN  XL) 150 MG 24 hr tablet Take 1 tablet (150 mg total) by mouth daily.     pramipexole  (MIRAPEX ) 0.25 MG tablet Take 1 tablet (0.25 mg total) by mouth 3 (three) times daily.     No current facility-administered medications for this visit.     ASSESSMENT & PLAN:   Assessment:  Tasha Campbell is a 48 y.o. female with long history of elevated white count without previous workup. She denies any chronic infections. She does have flares with arthritis. She has difficulty sleeping due to frequent night sweats. She also suffers with restless leg syndrome. Her most bothersome symptom is increasing fatigue. She states this has increased significantly. We discussed the multiple causes of leukocytosis including infection, inflammation, stress and possible malignancy. CBC today reveals slightly elevated white count 11.3 and hemoglobin 15.9. We again discussed that her elevated hemoglobin is likely related to her smoking status.   Plan: 1.  Return to clinic in 2 weeks to discuss pending labs including iron studies and BCR-ABL1.  I discussed the assessment and treatment plan with the patient.  The patient was provided an opportunity to ask questions and all were answered.  The patient agreed with the plan and demonstrated an understanding of the instructions.    Thank you for the referral    45 minutes was spent in patient care.  This included time spent preparing to see the patient (e.g., review of tests), obtaining and/or reviewing separately  obtained history, counseling and educating the patient/family/caregiver, ordering medications, tests, or procedures; documenting clinical information in the electronic or other health record, independently interpreting results and communicating results to the patient/family/caregiver as well as coordination of care.      Adelaide Adjutant, NP   Family Nurse Practitioner - Board Certified [facility] [phone]

## 2023-09-02 LAB — BCR-ABL1 FISH
Cells Analyzed: 200
Cells Counted: 200

## 2023-09-10 ENCOUNTER — Other Ambulatory Visit: Payer: Self-pay | Admitting: Hematology and Oncology

## 2023-09-10 DIAGNOSIS — D72829 Elevated white blood cell count, unspecified: Secondary | ICD-10-CM

## 2023-09-11 ENCOUNTER — Inpatient Hospital Stay: Attending: Hematology and Oncology | Admitting: Hematology and Oncology

## 2023-09-11 ENCOUNTER — Other Ambulatory Visit: Payer: Self-pay

## 2023-09-11 ENCOUNTER — Inpatient Hospital Stay

## 2023-09-11 VITALS — BP 117/73 | HR 81 | Temp 98.3°F | Ht 64.57 in | Wt 190.0 lb

## 2023-09-11 DIAGNOSIS — D72829 Elevated white blood cell count, unspecified: Secondary | ICD-10-CM | POA: Diagnosis present

## 2023-09-11 DIAGNOSIS — G2581 Restless legs syndrome: Secondary | ICD-10-CM | POA: Insufficient documentation

## 2023-09-11 DIAGNOSIS — R5383 Other fatigue: Secondary | ICD-10-CM | POA: Diagnosis not present

## 2023-09-11 DIAGNOSIS — R61 Generalized hyperhidrosis: Secondary | ICD-10-CM | POA: Diagnosis not present

## 2023-09-11 DIAGNOSIS — G479 Sleep disorder, unspecified: Secondary | ICD-10-CM | POA: Insufficient documentation

## 2023-09-11 DIAGNOSIS — M1990 Unspecified osteoarthritis, unspecified site: Secondary | ICD-10-CM | POA: Insufficient documentation

## 2023-09-11 LAB — CBC WITH DIFFERENTIAL (CANCER CENTER ONLY)
Abs Immature Granulocytes: 0.03 10*3/uL (ref 0.00–0.07)
Basophils Absolute: 0.1 10*3/uL (ref 0.0–0.1)
Basophils Relative: 1 %
Eosinophils Absolute: 0.2 10*3/uL (ref 0.0–0.5)
Eosinophils Relative: 2 %
HCT: 45.2 % (ref 36.0–46.0)
Hemoglobin: 15.3 g/dL — ABNORMAL HIGH (ref 12.0–15.0)
Immature Granulocytes: 0 %
Lymphocytes Relative: 40 %
Lymphs Abs: 4 10*3/uL (ref 0.7–4.0)
MCH: 28.7 pg (ref 26.0–34.0)
MCHC: 33.8 g/dL (ref 30.0–36.0)
MCV: 84.6 fL (ref 80.0–100.0)
Monocytes Absolute: 0.6 10*3/uL (ref 0.1–1.0)
Monocytes Relative: 6 %
Neutro Abs: 5.1 10*3/uL (ref 1.7–7.7)
Neutrophils Relative %: 51 %
Platelet Count: 368 10*3/uL (ref 150–400)
RBC: 5.34 MIL/uL — ABNORMAL HIGH (ref 3.87–5.11)
RDW: 12.8 % (ref 11.5–15.5)
WBC Count: 10 10*3/uL (ref 4.0–10.5)
nRBC: 0 % (ref 0.0–0.2)

## 2023-09-17 NOTE — Progress Notes (Signed)
 [facility] [address] [place],  [place]  [address] [phone]  Clinic Day:  09/17/2023   Referring physician: Georgean Kindle, MD  Patient Care Team: Patient Care Team: Georgean Kindle, MD as PCP - General (Internal Medicine)   REASON FOR CONSULTATION:  Leukocytosis  HISTORY OF PRESENT ILLNESS:  Tasha Campbell is a 48 y.o. female with a history of leukocytosis who is referred in consultation by Guyann Leitz, MD for assessment and management. She has a history of elevated white count dating back three years rising as high as 16. Most recent white count this month was 11.9. She denies any workup in the past for leukocytosis. She denies chronic or acute infection. She was on [facility] for one month which can elevate the white count mildly; however, her white count has been elevated on occasions prior to being on Wegovy. She stopped taking the medication, but expresses desire to return to using it as she had good success with it and minimal side effects. Of note, she also has a slightly elevated hemoglobin which is most likely related to smoking. Medical history is significant for ocular migraines, depression and arthritis. Surgical history consists of cholecystectomy and tubal ligation. No family medical history. She is an everyday smoker of 1/2 pack per day down from 2 packs per day. She rarely drinks alcohol and denies use of drugs.   REVIEW OF SYSTEMS:  Review of Systems  Constitutional: Negative.   HENT:  Negative.    Eyes: Negative.   Respiratory: Negative.    Cardiovascular: Negative.   Gastrointestinal: Negative.   Endocrine: Positive for hot flashes.  Genitourinary: Negative.    Musculoskeletal:  Positive for arthralgias.  Skin: Negative.   Neurological:  Positive for headaches.  Hematological: Negative.   Psychiatric/Behavioral:  Positive for depression and sleep disturbance.      VITALS:   Blood pressure 117/73, pulse 81, temperature 98.3 F (36.8  C), temperature source Oral, height 5' 4.57 (1.64 m), weight 190 lb (86.2 kg), SpO2 100%.  Wt Readings from Last 3 Encounters:  09/11/23 190 lb (86.2 kg)  08/28/23 191 lb (86.6 kg)  08/24/20 187 lb 8 oz (85 kg)    Body mass index is 32.04 kg/m.  Performance status (ECOG): 1 - Symptomatic but completely ambulatory  PHYSICAL EXAM:  Physical Exam Constitutional:      Appearance: Normal appearance. She is normal weight.  HENT:     Head: Normocephalic and atraumatic.     Mouth/Throat:     Mouth: Mucous membranes are moist.  Cardiovascular:     Rate and Rhythm: Normal rate and regular rhythm.     Pulses: Normal pulses.     Heart sounds: Normal heart sounds.  Pulmonary:     Effort: Pulmonary effort is normal.     Breath sounds: Normal breath sounds.  Abdominal:     General: Abdomen is flat.     Palpations: Abdomen is soft.  Musculoskeletal:        General: Normal range of motion.     Cervical back: Normal range of motion.  Skin:    General: Skin is warm and dry.  Neurological:     General: No focal deficit present.     Mental Status: She is alert and oriented to person, place, and time. Mental status is at baseline.  Psychiatric:        Mood and Affect: Mood normal.        Behavior: Behavior normal.  Thought Content: Thought content normal.        Judgment: Judgment normal.      LABS:      Latest Ref Rng & Units 09/11/2023    3:15 PM 08/28/2023   10:32 AM  CBC  WBC 4.0 - 10.5 K/uL 10.0  11.3   Hemoglobin 12.0 - 15.0 g/dL 16.1  09.6   Hematocrit 36.0 - 46.0 % 45.2  48.3   Platelets 150 - 400 K/uL 368  355       Latest Ref Rng & Units 08/28/2023   10:32 AM  CMP  Glucose 70 - 99 mg/dL 89   BUN 6 - 20 mg/dL 14   Creatinine 0.45 - 1.00 mg/dL 4.09   Sodium 811 - 914 mmol/L 140   Potassium 3.5 - 5.1 mmol/L 4.0   Chloride 98 - 111 mmol/L 103   CO2 22 - 32 mmol/L 25   Calcium 8.9 - 10.3 mg/dL 78.2   Total Protein 6.5 - 8.1 g/dL 7.5   Total Bilirubin 0.0 -  1.2 mg/dL 0.2   Alkaline Phos 38 - 126 U/L 94   AST 15 - 41 U/L 15   ALT 0 - 44 U/L 15      No results found for: CEA1, CEA / No results found for: CEA1, CEA No results found for: PSA1 No results found for: NFA213 No results found for: YQM578  No results found for: TOTALPROTELP, ALBUMINELP, A1GS, A2GS, BETS, BETA2SER, GAMS, MSPIKE, SPEI Lab Results  Component Value Date   TIBC 391 08/28/2023   FERRITIN 67 08/28/2023   IRONPCTSAT 29 08/28/2023   No results found for: LDH  STUDIES:  No results found.    HISTORY:   Past Medical History:  Diagnosis Date   Migraines    Occular   Restless legs     Past Surgical History:  Procedure Laterality Date   BREAST DUCTAL SYSTEM EXCISION Left    BREAST LUMPECTOMY Left    CESAREAN SECTION N/A    CHOLECYSTECTOMY     CYSTOSCOPY W/ DILATION OF BLADDER     TUBAL LIGATION N/A     Family History  Problem Relation Age of Onset   Hydrocephalus Cousin        paternal first cousin   Breast cancer Mother    Breast cancer Maternal Grandmother     Social History:  reports that she has been smoking cigarettes. She started smoking about 30 years ago. She has a 60.9 pack-year smoking history. She has never used smokeless tobacco. She reports that she does not use drugs. No history on file for alcohol use.The patient is alone today.  Allergies: Not on File  Current Medications: Current Outpatient Medications  Medication Sig Dispense Refill   atorvastatin (LIPITOR) 40 MG tablet Take 40 mg by mouth daily.     buPROPion  (WELLBUTRIN  XL) 150 MG 24 hr tablet Take 1 tablet (150 mg total) by mouth daily.     nitroGLYCERIN (NITROSTAT) 0.4 MG SL tablet Place 0.4 mg under the tongue every 5 (five) minutes as needed.     pramipexole  (MIRAPEX ) 0.25 MG tablet Take 1 tablet (0.25 mg total) by mouth 3 (three) times daily.     RANEXA 500 MG 12 hr tablet Take 500 mg by mouth 2 (two) times daily.     TOPROL XL 25 MG 24 hr  tablet Take 25 mg by mouth daily.     No current facility-administered medications for this visit.     ASSESSMENT &  PLAN:   Assessment:  Tasha Campbell is a 48 y.o. female with long history of elevated white count without previous workup. She denies any chronic infections. She does have flares with arthritis. She has difficulty sleeping due to frequent night sweats. She also suffers with restless leg syndrome. Her most bothersome symptom is increasing fatigue. She states this has increased significantly. We discussed the multiple causes of leukocytosis including infection, inflammation, stress and possible malignancy. CBC today reveals normal white count at 10.0 and slightly increased hemoglobin at 15.3. We again discussed that her elevated hemoglobin is likely related to her smoking status.   Plan: 1.  Pending labs were all normal; will return on an as needed basis.   I discussed the assessment and treatment plan with the patient.  The patient was provided an opportunity to ask questions and all were answered.  The patient agreed with the plan and demonstrated an understanding of the instructions.    Thank you for the referral    30  minutes was spent in patient care.  This included time spent preparing to see the patient (e.g., review of tests), obtaining and/or reviewing separately obtained history, counseling and educating the patient/family/caregiver, ordering medications, tests, or procedures; documenting clinical information in the electronic or other health record, independently interpreting results and communicating results to the patient/family/caregiver as well as coordination of care.      Adelaide Adjutant, NP   Family Nurse Practitioner - Board Certified [facility] [phone]

## 2024-05-05 ENCOUNTER — Ambulatory Visit (HOSPITAL_BASED_OUTPATIENT_CLINIC_OR_DEPARTMENT_OTHER)
Admission: RE | Admit: 2024-05-05 | Discharge: 2024-05-05 | Disposition: A | Discharge status: Home / Self Care | Source: Ambulatory Visit | Attending: Family Medicine | Admitting: Family Medicine

## 2024-05-05 ENCOUNTER — Other Ambulatory Visit (HOSPITAL_BASED_OUTPATIENT_CLINIC_OR_DEPARTMENT_OTHER): Payer: Self-pay

## 2024-05-05 ENCOUNTER — Encounter (HOSPITAL_BASED_OUTPATIENT_CLINIC_OR_DEPARTMENT_OTHER): Payer: Self-pay

## 2024-05-05 VITALS — BP 131/88 | HR 100 | Temp 98.5°F | Resp 20

## 2024-05-05 DIAGNOSIS — R0602 Shortness of breath: Secondary | ICD-10-CM

## 2024-05-05 DIAGNOSIS — J029 Acute pharyngitis, unspecified: Secondary | ICD-10-CM

## 2024-05-05 DIAGNOSIS — R051 Acute cough: Secondary | ICD-10-CM | POA: Diagnosis not present

## 2024-05-05 DIAGNOSIS — J069 Acute upper respiratory infection, unspecified: Secondary | ICD-10-CM

## 2024-05-05 LAB — POCT RAPID STREP A (OFFICE): Rapid Strep A Screen: NEGATIVE

## 2024-05-05 MED ORDER — IPRATROPIUM-ALBUTEROL 0.5-2.5 (3) MG/3ML IN SOLN
3.0000 mL | Freq: Once | RESPIRATORY_TRACT | Status: AC
  Administered 2024-05-05: 3 mL via RESPIRATORY_TRACT

## 2024-05-05 MED ORDER — PROMETHAZINE-DM 6.25-15 MG/5ML PO SYRP
5.0000 mL | ORAL_SOLUTION | Freq: Four times a day (QID) | ORAL | 0 refills | Status: AC | PRN
  Filled 2024-05-05: qty 118, 6d supply, fill #0

## 2024-05-05 MED ORDER — AIRSUPRA 90-80 MCG/ACT IN AERO
2.0000 | INHALATION_SPRAY | RESPIRATORY_TRACT | 0 refills | Status: AC | PRN
  Filled 2024-05-05: qty 10.7, 10d supply, fill #0

## 2024-05-05 NOTE — ED Triage Notes (Signed)
 Pt c/o cough, nasal congestion, slight HA, sore throat, and chest tightness for a week. Pt as taken dayquil, nyquil, and mucinex and feels like her symptoms have continued to get worst over the week.

## 2024-05-05 NOTE — Discharge Instructions (Addendum)
 Viral upper respiratory infection with cough, shortness of breath, sore throat: Rapid strep is negative.  Throat culture not needed.  Significant improvement in her shortness of breath after DuoNeb treatment.  Airsupra  inhaler, 2 puffs, every 4 hours if needed for wheezing.  Get plenty of fluids and rest.  Promethazine  DM, 5 mL, every 6 hours if needed for cough.  Work excuse if needed.  Follow-up if symptoms do not improve, worsen or new symptoms occur.

## 2024-05-05 NOTE — ED Provider Notes (Signed)
 " [facility]    CSN: [id number] Arrival date & time: 05/05/24  0806      History   Chief Complaint Chief Complaint  Patient presents with   Cough    Cough and chest congestion. Has been getting worse over a week. - Entered by patient    HPI Tasha Campbell is a 49 y.o. female.   49 year old female with cough and nasal congestion that started on approximately 04/28/2024 or earlier and got much worse on Monday, 05/03/24.  Tasha Campbell also has a mild headache, sore throat and some chest tightness.  Tasha Campbell has used OTC Dayquil, Nyquil and Mucinex.  Tasha Campbell is a smoker.  Her chest tightness is only with breathing, not at rest and not chest pressure.   Cough Associated symptoms: headaches, rhinorrhea, shortness of breath, sore throat and wheezing   Associated symptoms: no chest pain, no chills, no ear pain, no fever and no rash     Past Medical History:  Diagnosis Date   Migraines    Occular   Restless legs     Patient Active Problem List   Diagnosis Date Noted   At high risk for breast cancer 01/07/2018    Past Surgical History:  Procedure Laterality Date   BREAST DUCTAL SYSTEM EXCISION Left    BREAST LUMPECTOMY Left    CESAREAN SECTION N/A    CHOLECYSTECTOMY     CYSTOSCOPY W/ DILATION OF BLADDER     TUBAL LIGATION N/A     OB History   No obstetric history on file.      Home Medications    Prior to Admission medications  Medication Sig Start Date End Date Taking? Authorizing Provider  Albuterol -Budesonide  (AIRSUPRA ) 90-80 MCG/ACT AERO Inhale 2 puffs into the lungs every 4 (four) hours as needed (wheezing.  Rinse mouth after use). 05/05/24  Yes Ival Domino, FNP  promethazine -dextromethorphan (PROMETHAZINE -DM) 6.25-15 MG/5ML syrup Take 5 mLs by mouth 4 (four) times daily as needed for cough. Do not use and drive - May make drowsy. 05/05/24  Yes Ival Domino, FNP  atorvastatin (LIPITOR) 40 MG tablet Take 40 mg by mouth daily. 09/05/23   [provider]   buPROPion  (WELLBUTRIN  XL) 150 MG 24 hr tablet Take 1 tablet (150 mg total) by mouth daily. 08/24/20   Gudena, Vinay, MD  nitroGLYCERIN (NITROSTAT) 0.4 MG SL tablet Place 0.4 mg under the tongue every 5 (five) minutes as needed. 09/04/23   [provider]  pramipexole  (MIRAPEX ) 0.25 MG tablet Take 1 tablet (0.25 mg total) by mouth 3 (three) times daily. 08/24/20   Gudena, Vinay, MD  TOPROL XL 25 MG 24 hr tablet Take 50 mg by mouth daily. 09/04/23   [provider]    Family History Family History  Problem Relation Age of Onset   Hydrocephalus Cousin        paternal first cousin   Breast cancer Mother    Breast cancer Maternal Grandmother     Social History Social History[1]   Allergies   Patient has no known allergies.   Review of Systems Review of Systems  Constitutional:  Negative for chills and fever.  HENT:  Positive for congestion, postnasal drip, rhinorrhea, sinus pressure and sore throat. Negative for ear pain and sinus pain.   Eyes:  Negative for pain and visual disturbance.  Respiratory:  Positive for cough, chest tightness, shortness of breath and wheezing.   Cardiovascular:  Negative for chest pain and palpitations.  Gastrointestinal:  Negative for  abdominal pain, constipation, diarrhea, nausea and vomiting.  Genitourinary:  Negative for dysuria and hematuria.  Musculoskeletal:  Negative for arthralgias and back pain.  Skin:  Negative for color change and rash.  Neurological:  Positive for headaches. Negative for seizures and syncope.  All other systems reviewed and are negative.    Physical Exam Triage Vital Signs ED Triage Vitals  Encounter Vitals Group     BP 05/05/24 0820 131/88     Girls Systolic BP Percentile --      Girls Diastolic BP Percentile --      Boys Systolic BP Percentile --      Boys Diastolic BP Percentile --      Pulse Rate 05/05/24 0820 100     Resp 05/05/24 0820 20     Temp 05/05/24 0820 98.5 F (36.9 C)     Temp  Source 05/05/24 0820 Oral     SpO2 05/05/24 0820 98 %     Weight --      Height --      Head Circumference --      Peak Flow --      Pain Score 05/05/24 0819 0     Pain Loc --      Pain Education --      Exclude from Growth Chart --    No data found.  Updated Vital Signs BP 131/88 (BP Location: Right Arm)   Pulse 100   Temp 98.5 F (36.9 C) (Oral)   Resp 20   LMP 10/07/2017   SpO2 98%   Visual Acuity Right Eye Distance:   Left Eye Distance:   Bilateral Distance:    Right Eye Near:   Left Eye Near:    Bilateral Near:     Physical Exam Vitals and nursing note reviewed.  Constitutional:      General: Tasha Campbell is not in acute distress.    Appearance: Tasha Campbell is well-developed. Tasha Campbell is not ill-appearing or toxic-appearing.  HENT:     Head: Normocephalic and atraumatic.     Right Ear: Hearing, tympanic membrane, ear canal and external ear normal.     Left Ear: Hearing, tympanic membrane, ear canal and external ear normal.     Nose: Congestion and rhinorrhea present. Rhinorrhea is clear.     Right Sinus: No maxillary sinus tenderness or frontal sinus tenderness.     Left Sinus: No maxillary sinus tenderness or frontal sinus tenderness.     Mouth/Throat:     Lips: Pink.     Mouth: Mucous membranes are moist.     Pharynx: Uvula midline. Posterior oropharyngeal erythema present. No oropharyngeal exudate.     Tonsils: No tonsillar exudate (Minimal enlargement, moderate erythema but no exudate).  Eyes:     Conjunctiva/sclera: Conjunctivae normal.     Pupils: Pupils are equal, round, and reactive to light.  Cardiovascular:     Rate and Rhythm: Normal rate and regular rhythm.     Heart sounds: S1 normal and S2 normal. No murmur heard. Pulmonary:     Effort: Pulmonary effort is normal. No respiratory distress.     Breath sounds: Normal breath sounds. No decreased breath sounds, wheezing, rhonchi or rales.     Comments: Pulse ox was 98% on room air and lung sounds were clear on exam  but patient still felt a tightness in her chest with breathing and feels short of breath.  Reassessment after DuoNeb treatment: Oxygen saturation is 98% on room air after the breathing treatment.  Breath sounds  remain clear but the patient feels like Tasha Campbell is breathing easier since the treatment.  It did stimulate a lot of coughing and Tasha Campbell thought that was good to. Abdominal:     General: Bowel sounds are normal.     Palpations: Abdomen is soft.     Tenderness: There is no abdominal tenderness.  Musculoskeletal:        General: No swelling.     Cervical back: Neck supple.  Lymphadenopathy:     Head:     Right side of head: No submental, submandibular, tonsillar, preauricular or posterior auricular adenopathy.     Left side of head: No submental, submandibular, tonsillar, preauricular or posterior auricular adenopathy.     Cervical: Cervical adenopathy present.     Right cervical: Superficial cervical adenopathy present.     Left cervical: Superficial cervical adenopathy present.  Skin:    General: Skin is warm and dry.     Capillary Refill: Capillary refill takes less than 2 seconds.     Findings: No rash.  Neurological:     Mental Status: Tasha Campbell is alert and oriented to person, place, and time.  Psychiatric:        Mood and Affect: Mood normal.      UC Treatments / Results  Labs (all labs ordered are listed, but only abnormal results are displayed) Labs Reviewed  POCT RAPID STREP A (OFFICE) - Normal    EKG   Radiology No results found.  Procedures Procedures (including critical care time)  Medications Ordered in UC Medications  ipratropium-albuterol  (DUONEB) 0.5-2.5 (3) MG/3ML nebulizer solution 3 mL (3 mLs Nebulization Given 05/05/24 0834)    Initial Impression / Assessment and Plan / UC Course  I have reviewed the triage vital signs and the nursing notes.  Pertinent labs & imaging results that were available during my care of the patient were reviewed by me and  considered in my medical decision making (see chart for details).  Plan of Care (see discharge instructions for additional patient precautions and education): Viral upper respiratory infection with cough, shortness of breath, sore throat: Rapid strep is negative.  Throat culture not needed.  Significant improvement in her shortness of breath after DuoNeb treatment.  Airsupra  inhaler, 2 puffs, every 4 hours if needed for wheezing.  Get plenty of fluids and rest.  Promethazine  DM, 5 mL, every 6 hours if needed for cough.  Work excuse if needed.  Follow-up if symptoms do not improve, worsen or new symptoms occur.  I reviewed the plan of care with the patient and/or the patient's guardian.  The patient and/or guardian had time to ask questions and acknowledged that the questions were answered.  Final Clinical Impressions(s) / UC Diagnoses   Final diagnoses:  Acute cough  Sore throat  Viral URI with cough  Shortness of breath     Discharge Instructions      Viral upper respiratory infection with cough, shortness of breath, sore throat: Rapid strep is negative.  Throat culture not needed.  Significant improvement in her shortness of breath after DuoNeb treatment.  Airsupra  inhaler, 2 puffs, every 4 hours if needed for wheezing.  Get plenty of fluids and rest.  Promethazine  DM, 5 mL, every 6 hours if needed for cough.  Work excuse if needed.  Follow-up if symptoms do not improve, worsen or new symptoms occur.     ED Prescriptions     Medication Sig Dispense Auth. Provider   Albuterol -Budesonide  (AIRSUPRA ) 90-80 MCG/ACT AERO Inhale 2 puffs into  the lungs every 4 (four) hours as needed (wheezing.  Rinse mouth after use). 10.7 g Ival Domino, FNP   promethazine -dextromethorphan (PROMETHAZINE -DM) 6.25-15 MG/5ML syrup Take 5 mLs by mouth 4 (four) times daily as needed for cough. Do not use and drive - May make drowsy. 118 mL Ival Domino, FNP      PDMP not reviewed this encounter.    [1]   Social History Tobacco Use   Smoking status: Every Day    Current packs/day: 2.00    Average packs/day: 2.0 packs/day for 31.1 years (62.1 ttl pk-yrs)    Types: Cigarettes    Start date: 1995   Smokeless tobacco: Never  Substance Use Topics   Drug use: Never     Ival Domino, FNP 05/05/24 [phone]  "

## 2024-05-05 NOTE — Medical Student Note (Signed)
 "  [facility] Provider Student Note For educational purposes for Medical, PA and NP students only and not part of the legal medical record.   CSN: [id number] Arrival date & time: 05/05/24  0806      History   Chief Complaint Chief Complaint  Patient presents with   Cough    Cough and chest congestion. Has been getting worse over a week. - Entered by patient    HPI Tasha Campbell is a 49 y.o. female with a hx of HLD, and tachycardia.  Today she presents with 2 day hx of sinus pressure, nasal congestion, post-nasal drip, and productive cough. She is coughing up thick tan sputum. She does report having rhinorrhea last week. She has progressively had worsening of her symptoms. She is having some chest tightness with respirations that is not worse with activity. She denies ShOB, HA, body aches, fever, or chills. She denies known sick contacts, but does work in a dialysis center and wears a mask. She has been taking Dayquil, Nyquil, and mucinex with minimal relief of her symptoms.   The history is provided by the patient.  Cough Associated symptoms: no chest pain, no chills, no fever, no headaches, no myalgias, no shortness of breath, no sore throat and no wheezing     Past Medical History:  Diagnosis Date   Migraines    Occular   Restless legs     Patient Active Problem List   Diagnosis Date Noted   At high risk for breast cancer 01/07/2018    Past Surgical History:  Procedure Laterality Date   BREAST DUCTAL SYSTEM EXCISION Left    BREAST LUMPECTOMY Left    CESAREAN SECTION N/A    CHOLECYSTECTOMY     CYSTOSCOPY W/ DILATION OF BLADDER     TUBAL LIGATION N/A     OB History   No obstetric history on file.      Home Medications    Prior to Admission medications  Medication Sig Start Date End Date Taking? Authorizing Provider  atorvastatin (LIPITOR) 40 MG tablet Take 40 mg by mouth daily. 09/05/23   [provider]  buPROPion  (WELLBUTRIN  XL) 150  MG 24 hr tablet Take 1 tablet (150 mg total) by mouth daily. 08/24/20   Gudena, Vinay, MD  nitroGLYCERIN (NITROSTAT) 0.4 MG SL tablet Place 0.4 mg under the tongue every 5 (five) minutes as needed. 09/04/23   [provider]  pramipexole  (MIRAPEX ) 0.25 MG tablet Take 1 tablet (0.25 mg total) by mouth 3 (three) times daily. 08/24/20   Gudena, Vinay, MD  TOPROL XL 25 MG 24 hr tablet Take 50 mg by mouth daily. 09/04/23   [provider]    Family History Family History  Problem Relation Age of Onset   Hydrocephalus Cousin        paternal first cousin   Breast cancer Mother    Breast cancer Maternal Grandmother     Social History Social History[1]   Allergies   Patient has no known allergies.   Review of Systems Review of Systems  Constitutional:  Negative for chills and fever.  HENT:  Positive for congestion, postnasal drip, sinus pressure and sinus pain. Negative for sore throat.   Respiratory:  Positive for cough and chest tightness. Negative for shortness of breath and wheezing.   Cardiovascular:  Negative for chest pain and palpitations.  Gastrointestinal:  Negative for abdominal pain, diarrhea, nausea and vomiting.  Musculoskeletal:  Negative for myalgias.  Neurological:  Negative for  headaches.     Physical Exam Updated Vital Signs BP 131/88 (BP Location: Right Arm)   Pulse 100   Temp 98.5 F (36.9 C) (Oral)   Resp 20   LMP 10/07/2017   SpO2 98%   Physical Exam Constitutional:      Appearance: Normal appearance.  HENT:     Right Ear: Tympanic membrane, ear canal and external ear normal.     Left Ear: Tympanic membrane, ear canal and external ear normal.     Nose: Congestion present.     Mouth/Throat:     Mouth: Mucous membranes are moist.     Pharynx: Oropharynx is clear. Posterior oropharyngeal erythema and postnasal drip present. No oropharyngeal exudate.  Cardiovascular:     Rate and Rhythm: Normal rate and regular rhythm.     Heart sounds:  Normal heart sounds. No murmur heard.    No friction rub. No gallop.  Pulmonary:     Effort: Pulmonary effort is normal.     Breath sounds: Normal breath sounds.  Musculoskeletal:     Cervical back: Neck supple.  Lymphadenopathy:     Cervical: No cervical adenopathy.  Skin:    General: Skin is warm and dry.  Neurological:     Mental Status: She is alert and oriented to person, place, and time.  Psychiatric:        Mood and Affect: Mood normal.        Behavior: Behavior normal.      ED Treatments / Results  Labs (all labs ordered are listed, but only abnormal results are displayed) Labs Reviewed - No data to display  EKG  Radiology No results found.  Procedures Procedures (including critical care time)  Medications Ordered in ED Medications  ipratropium-albuterol  (DUONEB) 0.5-2.5 (3) MG/3ML nebulizer solution 3 mL (has no administration in time range)     Initial Impression / Assessment and Plan / ED Course  I have reviewed the triage vital signs and the nursing notes.  Pertinent labs & imaging results that were available during my care of the patient were reviewed by me and considered in my medical decision making (see chart for details).    Viral Upper Respiratory Illness  POC rapid strep is negative  Administered Duoneb 0.5-25 mg HHN for cough and chest tightness symptoms.  Upon reassessment after the nebulizer treatment she feels some relief in the chest tightness and feels that it stimulated her cough to loosen the mucus.  Symptoms likely from viral URI. Discussed CXR with patient, but given the symptoms and exam I do not believe it is warranted at this time. Pt was agreeable with plan. She can continue Dayquil and Mucinex. Will start Promethazine -DM for the cough at night, but instructed not to take Dayquil, Nyquil, or any other medicine that contains Dextromethorphan while taking the prescribed cough syrup. Will also start AirSupra  90-80 mcg MDI prn every 4  hours for chest tightness or wheezing.  Counseled pt on aggressive hydration and use of humidifier, steam, and hot teas. Return if symptoms worsen or do not improve.   Red flag symptoms reviewed and return precautions given.    Final Clinical Impressions(s) / ED Diagnoses   Final diagnoses:  Acute cough    New Prescriptions New Prescriptions   No medications on file       [1]  Social History Tobacco Use   Smoking status: Every Day    Current packs/day: 2.00    Average packs/day: 2.0 packs/day for 31.1 years (62.1 ttl  pk-yrs)    Types: Cigarettes    Start date: 1995   Smokeless tobacco: Never  Substance Use Topics   Drug use: Never   "
